# Patient Record
Sex: Male | Born: 1975 | Race: White | Hispanic: No | Marital: Single | Smoking: Never smoker
Health system: Southern US, Community
[De-identification: ages and names within clinical notes are randomized; demographics above are authoritative.]

---

## 2006-06-25 ENCOUNTER — Emergency Department (HOSPITAL_COMMUNITY): Admission: EM | Admit: 2006-06-25 | Discharge: 2006-06-25 | Payer: Self-pay | Admitting: *Deleted
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
## 2015-11-11 ENCOUNTER — Emergency Department (HOSPITAL_COMMUNITY)
Admission: EM | Admit: 2015-11-11 | Discharge: 2015-11-11 | Disposition: A | Discharge status: Home / Self Care | Payer: Worker's Compensation | Attending: Emergency Medicine | Admitting: Emergency Medicine

## 2015-11-11 ENCOUNTER — Emergency Department (HOSPITAL_COMMUNITY): Payer: Worker's Compensation

## 2015-11-11 ENCOUNTER — Encounter (HOSPITAL_COMMUNITY): Payer: Self-pay

## 2015-11-11 DIAGNOSIS — S20311A Abrasion of right front wall of thorax, initial encounter: Secondary | ICD-10-CM | POA: Insufficient documentation

## 2015-11-11 DIAGNOSIS — Y939 Activity, unspecified: Secondary | ICD-10-CM | POA: Diagnosis not present

## 2015-11-11 DIAGNOSIS — R1011 Right upper quadrant pain: Secondary | ICD-10-CM | POA: Insufficient documentation

## 2015-11-11 DIAGNOSIS — Y92410 Unspecified street and highway as the place of occurrence of the external cause: Secondary | ICD-10-CM | POA: Insufficient documentation

## 2015-11-11 DIAGNOSIS — R0781 Pleurodynia: Secondary | ICD-10-CM

## 2015-11-11 DIAGNOSIS — S81812A Laceration without foreign body, left lower leg, initial encounter: Secondary | ICD-10-CM | POA: Insufficient documentation

## 2015-11-11 DIAGNOSIS — M25571 Pain in right ankle and joints of right foot: Secondary | ICD-10-CM

## 2015-11-11 DIAGNOSIS — Y999 Unspecified external cause status: Secondary | ICD-10-CM | POA: Insufficient documentation

## 2015-11-11 DIAGNOSIS — R0789 Other chest pain: Secondary | ICD-10-CM | POA: Insufficient documentation

## 2015-11-11 DIAGNOSIS — M25562 Pain in left knee: Secondary | ICD-10-CM

## 2015-11-11 LAB — URINALYSIS, ROUTINE W REFLEX MICROSCOPIC
BILIRUBIN URINE: NEGATIVE
GLUCOSE, UA: NEGATIVE mg/dL
HGB URINE DIPSTICK: NEGATIVE
Ketones, ur: NEGATIVE mg/dL
Leukocytes, UA: NEGATIVE
Nitrite: NEGATIVE
PH: 8 (ref 5.0–8.0)
Protein, ur: NEGATIVE mg/dL
SPECIFIC GRAVITY, URINE: 1.045 — AB (ref 1.005–1.030)

## 2015-11-11 MED ORDER — CEPHALEXIN 500 MG PO CAPS: 500.0000 mg | ORAL_CAPSULE | Freq: Two times a day (BID) | ORAL | 0 refills | Status: AC

## 2015-11-11 MED ORDER — CYCLOBENZAPRINE HCL 10 MG PO TABS
10.0000 mg | ORAL_TABLET | Freq: Once | ORAL | Status: AC
  Administered 2015-11-11: 10 mg via ORAL
  Filled 2015-11-11: qty 1

## 2015-11-11 MED ORDER — IOPAMIDOL (ISOVUE-300) INJECTION 61%
INTRAVENOUS | Status: AC
  Administered 2015-11-11: 100 mL
  Filled 2015-11-11: qty 100

## 2015-11-11 MED ORDER — CEFAZOLIN IN D5W 1 GM/50ML IV SOLN
1.0000 g | Freq: Once | INTRAVENOUS | Status: AC
  Administered 2015-11-11: 1 g via INTRAVENOUS
  Filled 2015-11-11: qty 50

## 2015-11-11 MED ORDER — NAPROXEN 500 MG PO TABS: 500.0000 mg | ORAL_TABLET | Freq: Two times a day (BID) | ORAL | 0 refills | Status: AC

## 2015-11-11 MED ORDER — CYCLOBENZAPRINE HCL 10 MG PO TABS: 10.0000 mg | ORAL_TABLET | Freq: Two times a day (BID) | ORAL | 0 refills | Status: AC | PRN

## 2015-11-11 MED ORDER — MORPHINE SULFATE (PF) 4 MG/ML IV SOLN
4.0000 mg | Freq: Once | INTRAVENOUS | Status: AC
  Administered 2015-11-11: 4 mg via INTRAVENOUS
  Filled 2015-11-11: qty 1

## 2015-11-11 MED ORDER — SODIUM CHLORIDE 0.9 % IV BOLUS (SEPSIS)
1000.0000 mL | Freq: Once | INTRAVENOUS | Status: AC
  Administered 2015-11-11: 1000 mL via INTRAVENOUS

## 2015-11-11 MED ORDER — LIDOCAINE-EPINEPHRINE 2 %-1:100000 IJ SOLN
10.0000 mL | Freq: Once | INTRAMUSCULAR | Status: AC
  Administered 2015-11-11: 10 mL via INTRADERMAL
  Filled 2015-11-11: qty 10

## 2015-11-11 NOTE — ED Notes (Signed)
Ortho tech at bedside applying post op boot at pt.'s right lower leg .
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
## 2015-11-11 NOTE — ED Notes (Signed)
ACA bandage applied at left knee .

## 2015-11-11 NOTE — Progress Notes (Signed)
Orthopedic Tech Progress Note Patient Details:  Glenn Oconnell 1975-12-21 [id number]  Ortho Devices Type of Ortho Device: CAM walker Ortho Device/Splint Location: RLE Ortho Device/Splint Interventions: Ordered, Application   Glenn Oconnell, Glenn Oconnell 11/11/2015, 8:31 PM

## 2015-11-11 NOTE — ED Notes (Signed)
Pt transported to xray 
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
